# Patient Record
Sex: Male | Born: 1965 | Race: White | Hispanic: No | Smoking: Former smoker
Health system: Southern US, Community
[De-identification: ages and names within clinical notes are randomized; demographics above are authoritative.]

## PROBLEM LIST (undated history)

## (undated) DIAGNOSIS — F429 Obsessive-compulsive disorder, unspecified: Secondary | ICD-10-CM

## (undated) DIAGNOSIS — F419 Anxiety disorder, unspecified: Secondary | ICD-10-CM

## (undated) DIAGNOSIS — E785 Hyperlipidemia, unspecified: Secondary | ICD-10-CM

## (undated) DIAGNOSIS — E079 Disorder of thyroid, unspecified: Secondary | ICD-10-CM

## (undated) HISTORY — PX: HERNIA REPAIR: SHX51

## (undated) HISTORY — DX: Obsessive-compulsive disorder, unspecified: F42.9

## (undated) HISTORY — DX: Disorder of thyroid, unspecified: E07.9

## (undated) HISTORY — DX: Hyperlipidemia, unspecified: E78.5

## (undated) HISTORY — DX: Anxiety disorder, unspecified: F41.9

---

## 1997-10-19 ENCOUNTER — Emergency Department (HOSPITAL_COMMUNITY): Admission: EM | Admit: 1997-10-19 | Discharge: 1997-10-19 | Payer: Self-pay | Admitting: Emergency Medicine

## 1997-11-20 ENCOUNTER — Ambulatory Visit (HOSPITAL_COMMUNITY): Admission: RE | Admit: 1997-11-20 | Discharge: 1997-11-20 | Payer: Self-pay | Admitting: Surgery

## 1999-01-23 ENCOUNTER — Emergency Department (HOSPITAL_COMMUNITY): Admission: EM | Admit: 1999-01-23 | Discharge: 1999-01-23 | Payer: Self-pay | Admitting: Emergency Medicine

## 1999-07-12 ENCOUNTER — Encounter: Payer: Self-pay | Admitting: Emergency Medicine

## 1999-07-12 ENCOUNTER — Emergency Department (HOSPITAL_COMMUNITY): Admission: EM | Admit: 1999-07-12 | Discharge: 1999-07-12 | Payer: Self-pay | Admitting: Emergency Medicine

## 2005-08-11 ENCOUNTER — Ambulatory Visit (HOSPITAL_BASED_OUTPATIENT_CLINIC_OR_DEPARTMENT_OTHER): Admission: RE | Admit: 2005-08-11 | Discharge: 2005-08-11 | Payer: Self-pay | Admitting: Otolaryngology

## 2005-11-15 ENCOUNTER — Emergency Department (HOSPITAL_COMMUNITY): Admission: EM | Admit: 2005-11-15 | Discharge: 2005-11-16 | Payer: Self-pay | Admitting: Emergency Medicine

## 2006-04-09 ENCOUNTER — Encounter: Admission: RE | Admit: 2006-04-09 | Discharge: 2006-04-09 | Payer: Self-pay | Admitting: Family Medicine

## 2006-04-16 ENCOUNTER — Encounter: Admission: RE | Admit: 2006-04-16 | Discharge: 2006-04-16 | Payer: Self-pay | Admitting: Family Medicine

## 2006-05-01 ENCOUNTER — Ambulatory Visit (HOSPITAL_COMMUNITY): Admission: RE | Admit: 2006-05-01 | Discharge: 2006-05-02 | Payer: Self-pay | Admitting: Neurosurgery

## 2007-03-30 ENCOUNTER — Ambulatory Visit: Payer: Self-pay | Admitting: "Endocrinology

## 2007-05-04 ENCOUNTER — Encounter: Admission: RE | Admit: 2007-05-04 | Discharge: 2007-05-04 | Payer: Self-pay | Admitting: Family Medicine

## 2007-07-14 ENCOUNTER — Ambulatory Visit: Payer: Self-pay | Admitting: "Endocrinology

## 2007-11-10 ENCOUNTER — Ambulatory Visit: Payer: Self-pay | Admitting: "Endocrinology

## 2008-03-15 ENCOUNTER — Ambulatory Visit: Payer: Self-pay | Admitting: "Endocrinology

## 2008-10-16 ENCOUNTER — Ambulatory Visit: Payer: Self-pay | Admitting: "Endocrinology

## 2010-05-19 ENCOUNTER — Encounter: Payer: Self-pay | Admitting: Internal Medicine

## 2010-09-13 NOTE — Op Note (Signed)
NAME:  Ryan Cuevas, Ryan Cuevas              ACCOUNT NO.:  [id number]   MEDICAL RECORD NO.:  [id number]          PATIENT TYPE:  OIB   LOCATION:  3004                         FACILITY:  [facility]   PHYSICIAN:  Reinaldo Meeker, M.D. DATE OF BIRTH:  08/25/1965   DATE OF PROCEDURE:  05/01/2006  DATE OF DISCHARGE:                               OPERATIVE REPORT   PREOPERATIVE DIAGNOSIS:  Herniated disk, C5-6.   POSTOPERATIVE DIAGNOSIS:  Herniated disk, C5-6.   PROCEDURE:  C5-6 anterior cervical diskectomy with bone bank fusion  followed by Mystique anterior cervical plating.   SURGEON:  Dr. Gerlene Fee.   ASSISTANT:  Dr. Donalee Citrin.   PROCEDURE IN DETAIL:  After being placed in the supine position in 10  pounds halter traction, the patient's neck was prepped and draped in  usual sterile fashion.  Localizing fluoroscopy was used prior to  incision to identify the appropriate level.  Transverse incision was  made in the right anterior neck, started at the midline, headed towards  the medial aspect of the sternocleidomastoid muscle.  The platysma  muscle was then incised transversely.  The natural fascial plane between  the strap muscles medially and sternocleidomastoid laterally was  identified and followed down to the anterior aspect of the cervical  spine.  Longus colli muscles were identified, split in the midline,  stripped away bilaterally with a __________  resector and Key elevator.  Self-retaining retractor was placed for exposure, and x-rays showed  approach to the appropriate level.  Using a 15 blade, the disk at C5-6  was incised.  Using pituitary rongeurs and curettes, approximately 90%  of disk material was removed.  High-speed drill was used to widen the  interspace.  Microscope was draped, brought in the field and used for  the remainder of the case.  Using microsection technique, the remainder  of the disk material down to the post longitudinal ligament was removed.  Wound was then  incised transversely, and the __________  removed with a  Kerrison punch.  A very large amount of hernia disk material was  identified all the way across the spinal dura and into the foramen  bilaterally.  Thorough decompression was carried out, particular on the  left symptomatic side where very large amounts of hernia disk material  were found within the foramen.  When these were all removed, however,  the C6 nerve root was well  visualized and decompressed.  Similar  decompression had already been completed on the opposite side.  At this  time, inspection was carried out in all directions for any evidence of  residual compression, and none could be identified.  Large amounts of  irrigation were carried out.  Any bleeding was controlled bipolar  coagulation and Gelfoam.  Measurements were taken, and a 7-mm bone bank  plug was reconstituted.  After irrigating once more and confirming  hemostasis, plug was impacted without difficulty, and fluoroscopy it to  be in good position.  The appropriate length Mystique anterior cervical  plate was then chosen.  Under fluoroscopic guidance, drill holes were  placed followed by tapping and placing 13-mm  screws x4.  Final  fluoroscopy showed the plate screws and plug to all be in good position.  Large amounts of irrigation were carried out, any bleeding controlled  bipolar coagulation.  The wound was then closed using interrupted Vicryl on the platysmal  muscle and inverted 5-0 PDS in the subcu layer and Steri-Strips on the  skin.  Sterile dressing and soft collar applied.  The patient was  extubated and taken to the recovery room in stable condition.           ______________________________  Reinaldo Meeker, M.D.     ROK/MEDQ  D:  05/01/2006  T:  05/01/2006  Job:  [id number]

## 2010-09-13 NOTE — Op Note (Signed)
NAME:  Ryan Cuevas, Ryan Cuevas              ACCOUNT NO.:  [id number]   MEDICAL RECORD NO.:  [id number]          PATIENT TYPE:  AMB   LOCATION:  DSC                          FACILITY:  [facility]   PHYSICIAN:  Jefry H. Pollyann Kennedy, MD     DATE OF BIRTH:  1965-10-02   DATE OF PROCEDURE:  08/11/2005  DATE OF DISCHARGE:                                 OPERATIVE REPORT   PREOPERATIVE DIAGNOSES:  1.  Nasal septal deviation.  2.  Bilateral inferior turbinate hypertrophy with obstruction.   POSTOPERATIVE DIAGNOSES:  1.  Nasal septal deviation.  2.  Bilateral inferior turbinate hypertrophy with obstruction.   PROCEDURES:  1.  Nasal septoplasty.  2.  Submucous resection, inferior turbinates bilaterally.   SURGEON:  Jefry H. Pollyann Kennedy, M.D.   ANESTHESIA:  General endotracheal anesthesia was used.   COMPLICATIONS:  No complications.   BLOOD LOSS:  Minimal.   REFERRING PHYSICIAN:  Dr. Woodfin Ganja.   FINDINGS:  Severe rightward septal deviation caused by a fracture of the  posterior quadrangular cartilage and anterior ethmoid plate with deflection  of the vomer and the maxillary crest also to the right side.  There was  compensatory enlargement of the turbinates.  Within the junction of the bony  and cartilaginous septum, there was a 1-cm area of soft tissue, which I  believe was from loss of cartilage from the past trauma.   HISTORY:  A 45 year old with a long history of nasal obstruction, history of  nasal trauma in the past.  Risks, benefits, alternatives, and complications  of the procedure were explained to the patient, who seemed to understand and  agreed to surgery.   PROCEDURE:  The patient was taken to the operating room and placed on the  operating table in the supine position.  Following induction of general  endotracheal anesthesia, the patient was prepped and draped in a standard  fashion.  Oxymetazoline spray was used preoperatively in the nasal cavities.  Xylocaine 1% with epinephrine was  infiltrated into the septum, the  columella, and the inferior turbinates.  Afrin-soaked pledgets were placed  in the nasal cavities bilaterally.   1.  Nasal septoplasty.  A left hemitransfixion incision was used to approach      the septal cartilage, and a mucosal flap was developed posteriorly to      the sphenoid rostrum on the left.  The bony cartilaginous junction, or      what remained of it, was divided, and a similar mucosal flap was      developed down the right side posteriorly.  The entire ethmoid plate was      resected with Jansen-Middleton rongeurs.  The vomerian bone was also      removed.  This treated the posterior obstruction.  The mucosal flap was      then developed anteriorly around the caudal edge of the quadrangular      cartilage.  Maxillary crest bone that was deflected to the right was      shaved off with a small osteotome.  The remainder of the cartilage was  left intact.  The mucosal incision was reapproximated with chromic      suture.  The septal flaps were quilted with plain gut.  Ninety percent      of the obstruction was relieved after this procedure.  2.  Submucous resection, inferior turbinates.  The leading edge of the      inferior turbinates was incised vertically with a 15 scalpel.  Mucosa      was dissected off of the bone, and bony fragments were resected using      Takahashi forceps.  The turbinate remnants were then outfractured.  The      nasal cavities were suctioned of blood and secretions as was the      pharynx.  The nasal cavities were then packed with rolled-up Telfa      coated with bacitracin ointment.  The patient was then awakened,      extubated, and transferred to recovery in good condition.      Jefry H. Pollyann Kennedy, MD  Electronically Signed     JHR/MEDQ  D:  08/11/2005  T:  08/11/2005  Job:  [id number]   cc:   Jethro Bastos, M.D.  Fax: [phone]

## 2013-06-12 ENCOUNTER — Encounter: Payer: Self-pay | Admitting: *Deleted

## 2013-06-12 DIAGNOSIS — R079 Chest pain, unspecified: Secondary | ICD-10-CM | POA: Insufficient documentation

## 2016-12-20 ENCOUNTER — Emergency Department (HOSPITAL_COMMUNITY): Payer: Worker's Compensation

## 2016-12-20 ENCOUNTER — Encounter (HOSPITAL_COMMUNITY): Payer: Self-pay | Admitting: Nurse Practitioner

## 2016-12-20 ENCOUNTER — Emergency Department (HOSPITAL_COMMUNITY)
Admission: EM | Admit: 2016-12-20 | Discharge: 2016-12-20 | Disposition: A | Discharge status: Home / Self Care | Payer: Worker's Compensation | Attending: Emergency Medicine | Admitting: Emergency Medicine

## 2016-12-20 DIAGNOSIS — Z79899 Other long term (current) drug therapy: Secondary | ICD-10-CM | POA: Insufficient documentation

## 2016-12-20 DIAGNOSIS — S61219A Laceration without foreign body of unspecified finger without damage to nail, initial encounter: Secondary | ICD-10-CM | POA: Insufficient documentation

## 2016-12-20 DIAGNOSIS — R55 Syncope and collapse: Secondary | ICD-10-CM | POA: Insufficient documentation

## 2016-12-20 DIAGNOSIS — Z23 Encounter for immunization: Secondary | ICD-10-CM | POA: Insufficient documentation

## 2016-12-20 DIAGNOSIS — Y929 Unspecified place or not applicable: Secondary | ICD-10-CM | POA: Diagnosis not present

## 2016-12-20 DIAGNOSIS — S6992XA Unspecified injury of left wrist, hand and finger(s), initial encounter: Secondary | ICD-10-CM | POA: Diagnosis present

## 2016-12-20 DIAGNOSIS — Y990 Civilian activity done for income or pay: Secondary | ICD-10-CM | POA: Insufficient documentation

## 2016-12-20 DIAGNOSIS — W19XXXA Unspecified fall, initial encounter: Secondary | ICD-10-CM | POA: Insufficient documentation

## 2016-12-20 DIAGNOSIS — Y9302 Activity, running: Secondary | ICD-10-CM | POA: Insufficient documentation

## 2016-12-20 MED ORDER — TETANUS-DIPHTH-ACELL PERTUSSIS 5-2.5-18.5 LF-MCG/0.5 IM SUSP
0.5000 mL | Freq: Once | INTRAMUSCULAR | Status: AC
  Administered 2016-12-20: 0.5 mL via INTRAMUSCULAR
  Filled 2016-12-20: qty 0.5

## 2016-12-20 MED ORDER — BACITRACIN ZINC 500 UNIT/GM EX OINT
1.0000 "application " | TOPICAL_OINTMENT | Freq: Two times a day (BID) | CUTANEOUS | Status: DC
  Administered 2016-12-20: 1 via TOPICAL
  Filled 2016-12-20: qty 2.7

## 2016-12-20 MED ORDER — LIDOCAINE HCL (PF) 1 % IJ SOLN
5.0000 mL | Freq: Once | INTRAMUSCULAR | Status: AC
  Administered 2016-12-20: 5 mL
  Filled 2016-12-20: qty 30

## 2016-12-20 MED ORDER — LIDOCAINE HCL (PF) 1 % IJ SOLN
INTRAMUSCULAR | Status: AC
  Filled 2016-12-20: qty 30

## 2016-12-20 MED ORDER — CEPHALEXIN 500 MG PO CAPS: 500.0000 mg | ORAL_CAPSULE | Freq: Two times a day (BID) | ORAL | 0 refills | Status: DC
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
## 2016-12-20 NOTE — ED Notes (Signed)
Pt stated to registration that this may be a workman's comp case. When discussing the situation with the patient, patient stated that he did not have any paperwork and his boss did not give him any paper work. Patient states that he will fill out the paperwork with his boss on Monday and proceed from there.

## 2016-12-24 ENCOUNTER — Encounter (HOSPITAL_COMMUNITY): Payer: Self-pay | Admitting: Emergency Medicine

## 2016-12-24 ENCOUNTER — Emergency Department (HOSPITAL_COMMUNITY)
Admission: EM | Admit: 2016-12-24 | Discharge: 2016-12-25 | Disposition: A | Discharge status: Home / Self Care | Payer: Worker's Compensation | Attending: Emergency Medicine | Admitting: Emergency Medicine

## 2016-12-24 DIAGNOSIS — S61215D Laceration without foreign body of left ring finger without damage to nail, subsequent encounter: Secondary | ICD-10-CM | POA: Diagnosis not present

## 2016-12-24 DIAGNOSIS — Z5189 Encounter for other specified aftercare: Secondary | ICD-10-CM

## 2016-12-24 DIAGNOSIS — Z79899 Other long term (current) drug therapy: Secondary | ICD-10-CM | POA: Diagnosis not present

## 2016-12-24 DIAGNOSIS — Z87891 Personal history of nicotine dependence: Secondary | ICD-10-CM | POA: Insufficient documentation

## 2016-12-24 DIAGNOSIS — L089 Local infection of the skin and subcutaneous tissue, unspecified: Secondary | ICD-10-CM | POA: Insufficient documentation

## 2016-12-24 DIAGNOSIS — W1830XA Fall on same level, unspecified, initial encounter: Secondary | ICD-10-CM | POA: Insufficient documentation

## 2016-12-24 LAB — COMPREHENSIVE METABOLIC PANEL
ALK PHOS: 79 U/L (ref 38–126)
ALT: 40 U/L (ref 17–63)
ANION GAP: 9 (ref 5–15)
AST: 33 U/L (ref 15–41)
Albumin: 4.1 g/dL (ref 3.5–5.0)
BUN: 19 mg/dL (ref 6–20)
CALCIUM: 9 mg/dL (ref 8.9–10.3)
CO2: 24 mmol/L (ref 22–32)
CREATININE: 1.39 mg/dL — AB (ref 0.61–1.24)
Chloride: 103 mmol/L (ref 101–111)
GFR, EST NON AFRICAN AMERICAN: 58 mL/min — AB (ref >60)
Glucose, Bld: 107 mg/dL — ABNORMAL HIGH (ref 65–99)
Potassium: 4.3 mmol/L (ref 3.5–5.1)
Sodium: 136 mmol/L (ref 135–145)
Total Bilirubin: 1.2 mg/dL (ref 0.3–1.2)
Total Protein: 7.2 g/dL (ref 6.5–8.1)

## 2016-12-24 LAB — CBC WITH DIFFERENTIAL/PLATELET
BASOS ABS: 0 10*3/uL (ref 0.0–0.1)
BASOS PCT: 0 %
EOS ABS: 0.1 10*3/uL (ref 0.0–0.7)
Eosinophils Relative: 2 %
HCT: 44.2 % (ref 39.0–52.0)
HEMOGLOBIN: 15.4 g/dL (ref 13.0–17.0)
Lymphocytes Relative: 32 %
Lymphs Abs: 2.5 10*3/uL (ref 0.7–4.0)
MCH: 30.5 pg (ref 26.0–34.0)
MCHC: 34.8 g/dL (ref 30.0–36.0)
MCV: 87.5 fL (ref 78.0–100.0)
Monocytes Absolute: 0.5 10*3/uL (ref 0.1–1.0)
Monocytes Relative: 6 %
NEUTROS ABS: 4.7 10*3/uL (ref 1.7–7.7)
NEUTROS PCT: 60 %
Platelets: 229 10*3/uL (ref 150–400)
RBC: 5.05 MIL/uL (ref 4.22–5.81)
RDW: 12.9 % (ref 11.5–15.5)
WBC: 7.9 10*3/uL (ref 4.0–10.5)

## 2016-12-24 LAB — I-STAT CG4 LACTIC ACID, ED: LACTIC ACID, VENOUS: 1.09 mmol/L (ref 0.5–1.9)

## 2016-12-24 MED ORDER — DOXYCYCLINE HYCLATE 100 MG PO TABS
100.0000 mg | ORAL_TABLET | Freq: Once | ORAL | Status: AC
  Administered 2016-12-25: 100 mg via ORAL
  Filled 2016-12-24: qty 1

## 2016-12-24 MED ORDER — LIDOCAINE HCL (PF) 1 % IJ SOLN
30.0000 mL | Freq: Once | INTRAMUSCULAR | Status: AC
  Administered 2016-12-25: 30 mL
  Filled 2016-12-24: qty 30

## 2016-12-24 MED ORDER — DOXYCYCLINE HYCLATE 100 MG PO CAPS: 100.0000 mg | ORAL_CAPSULE | Freq: Two times a day (BID) | ORAL | 0 refills | Status: AC

## 2016-12-24 NOTE — ED Triage Notes (Signed)
Pt had sutures placed Saturday to right hand; sites swelling and red at present time; concern for infection.

## 2016-12-24 NOTE — ED Provider Notes (Signed)
[facility]ER[facility]Y DEPT Provider Note   CSN: [id number] Arrival date & time: 12/24/16  1844     History   Chief Complaint Chief Complaint  Patient presents with  . Hand Pain  . Possible Cellulitis    HPI Ryan Cuevas is a 51 y.o. male.  Patient presents to the ED with a chief complaint of finger swelling.  Patient was seen last week after falling and sustaining an injury to his fingers.  He had several abrasions, and required to suturing in 2 locations.  He states that over the past day, he has had swelling to his left ring finger at the PIP.  He reports increased pain with movement of the finger.  He has been taking antibiotics as prescribed.  There are no other associated symptoms.  Specifically, he denies any fever, chills, numbness, weakness, or tingling.  He reports that he has been soaking the wound this week.   The history is provided by the patient. No language interpreter was used.    Past Medical History:  Diagnosis Date  . Anxiety   . Hyperlipidemia   . OCD (obsessive compulsive disorder)   . Thyroid disease     Patient Active Problem List   Diagnosis Date Noted  . Chest pain 06/12/2013    Past Surgical History:  Procedure Laterality Date  . HERNIA REPAIR         Home Medications    Prior to Admission medications   Medication Sig Start Date End Date Taking? Authorizing Provider  ALPRAZolam (XANAX XR) 0.5 MG 24 hr tablet Take 0.5 mg by mouth daily.  12/09/16   [provider]  cephALEXin (KEFLEX) 500 MG capsule Take 1 capsule (500 mg total) by mouth 2 (two) times daily. 12/20/16   Roxy Horseman, PA-C  doxycycline (VIBRAMYCIN) 100 MG capsule Take 100 mg by mouth daily.  12/08/16   [provider]  FLUoxetine (PROZAC) 40 MG capsule Take 40 mg by mouth daily.     [provider]  lamoTRIgine (LAMICTAL) 100 MG tablet Take 200 mg by mouth daily.  12/09/16   [provider]  propranolol (INDERAL) 10 MG tablet Take 1/2 to 1  tablet 30 to 60 minutes before needed for tremor control. 01/03/16   [provider]  zolpidem (AMBIEN) 10 MG tablet Take 10 mg by mouth at bedtime as needed for sleep.    [provider]    Family History Family History  Problem Relation Age of Onset  . CVA Mother   . Coronary artery disease Father   . CVA Father   . Hypertension Sister     Social History Social History  Substance Use Topics  . Smoking status: Former Smoker    Quit date: 06/12/2002  . Smokeless tobacco: Not on file  . Alcohol use No     Allergies   Patient has no known allergies.   Review of Systems Review of Systems  All other systems reviewed and are negative.    Physical Exam Updated Vital Signs BP (!) 112/99 (BP Location: Left Arm)   Pulse 91   Temp 98.3 F (36.8 C) (Oral)   Resp 18   Ht 5\' 10"  (1.778 m)   Wt 90.7 kg (200 lb)   SpO2 98%   BMI 28.70 kg/m   Physical Exam  Constitutional: He is oriented to person, place, and time. He appears well-developed and well-nourished.  HENT:  Head: Normocephalic and atraumatic.  Eyes: Conjunctivae and EOM are normal.  Neck:  Normal range of motion.  Cardiovascular: Normal rate.   Pulmonary/Chest: Effort normal.  Abdominal: He exhibits no distension.  Musculoskeletal: Normal range of motion.  Left ring finger with mild inflammation at erythema at the PIP ROM/strength reduced 2/2 pain  Neurological: He is alert and oriented to person, place, and time.  Skin: Skin is dry.  Sutures intact  Psychiatric: He has a normal mood and affect. His behavior is normal. Judgment and thought content normal.  Nursing note and vitals reviewed.    ED Treatments / Results  Labs (all labs ordered are listed, but only abnormal results are displayed) Labs Reviewed  COMPREHENSIVE METABOLIC PANEL - Abnormal; Notable for the following:       Result Value   Glucose, Bld 107 (*)    Creatinine, Ser 1.39 (*)    GFR calc non Af Amer 58 (*)    All  other components within normal limits  CBC WITH DIFFERENTIAL/PLATELET  I-STAT CG4 LACTIC ACID, ED  I-STAT CG4 LACTIC ACID, ED    EKG  EKG Interpretation None       Radiology No results found.  Procedures Procedures (including critical care time) INCISION AND DRAINAGE Performed by: Roxy Horseman Consent: Verbal consent obtained. Risks and benefits: risks, benefits and alternatives were discussed Type: abscess  Body area: Left dorsal ring finger at the PIP  Anesthesia: local infiltration  Incision was made with a scalpel. Nerve Block: digital block Local anesthetic: lidocaine 1% with epinephrine  Anesthetic total: 5 ml  Complexity: complex Blunt dissection to break up loculations Copiously irrigated with syringe  Drainage: scant purulence   Drainage amount:   Packing material: not packed  Patient tolerance: Patient tolerated the procedure well with no immediate complications.  Finger extension intact after procedure isolated at PIP and DIP.   Medications Ordered in ED Medications  lidocaine (PF) (XYLOCAINE) 1 % injection 30 mL (not administered)     Initial Impression / Assessment and Plan / ED Course  I have reviewed the triage vital signs and the nursing notes.  Pertinent labs & imaging results that were available during my care of the patient were reviewed by me and considered in my medical decision making (see chart for details).     Patient here for wound check.  Small cellulitis vs start of abscess formation at the left ring PIP dorsally.  I opened the wound, copiously irrigated and dressed the wound.  I will step his antibiotics up to doxy.    Return precautions given.  Final Clinical Impressions(s) / ED Diagnoses   Final diagnoses:  Finger infection  Visit for wound check    New Prescriptions New Prescriptions   DOXYCYCLINE (VIBRAMYCIN) 100 MG CAPSULE    Take 1 capsule (100 mg total) by mouth 2 (two) times daily.     Roxy Horseman, PA-C 12/25/16 0006    Gilda Crease, MD 12/25/16 [phone]

## 2016-12-24 NOTE — ED Notes (Signed)
ED PA at bedside

## 2016-12-25 NOTE — ED Notes (Signed)
Pt ambulatory and independent at discharge.  Verbalized understanding of discharge instructions 

## 2018-07-31 IMAGING — CR DG HAND COMPLETE 3+V*L*
3 series · 3 of 3 positions shown · non-contrast
Comparison: None.

CLINICAL DATA: Left hand pain after injury.

EXAM:
LEFT HAND - COMPLETE 3+ VIEW

[x hand pa left]
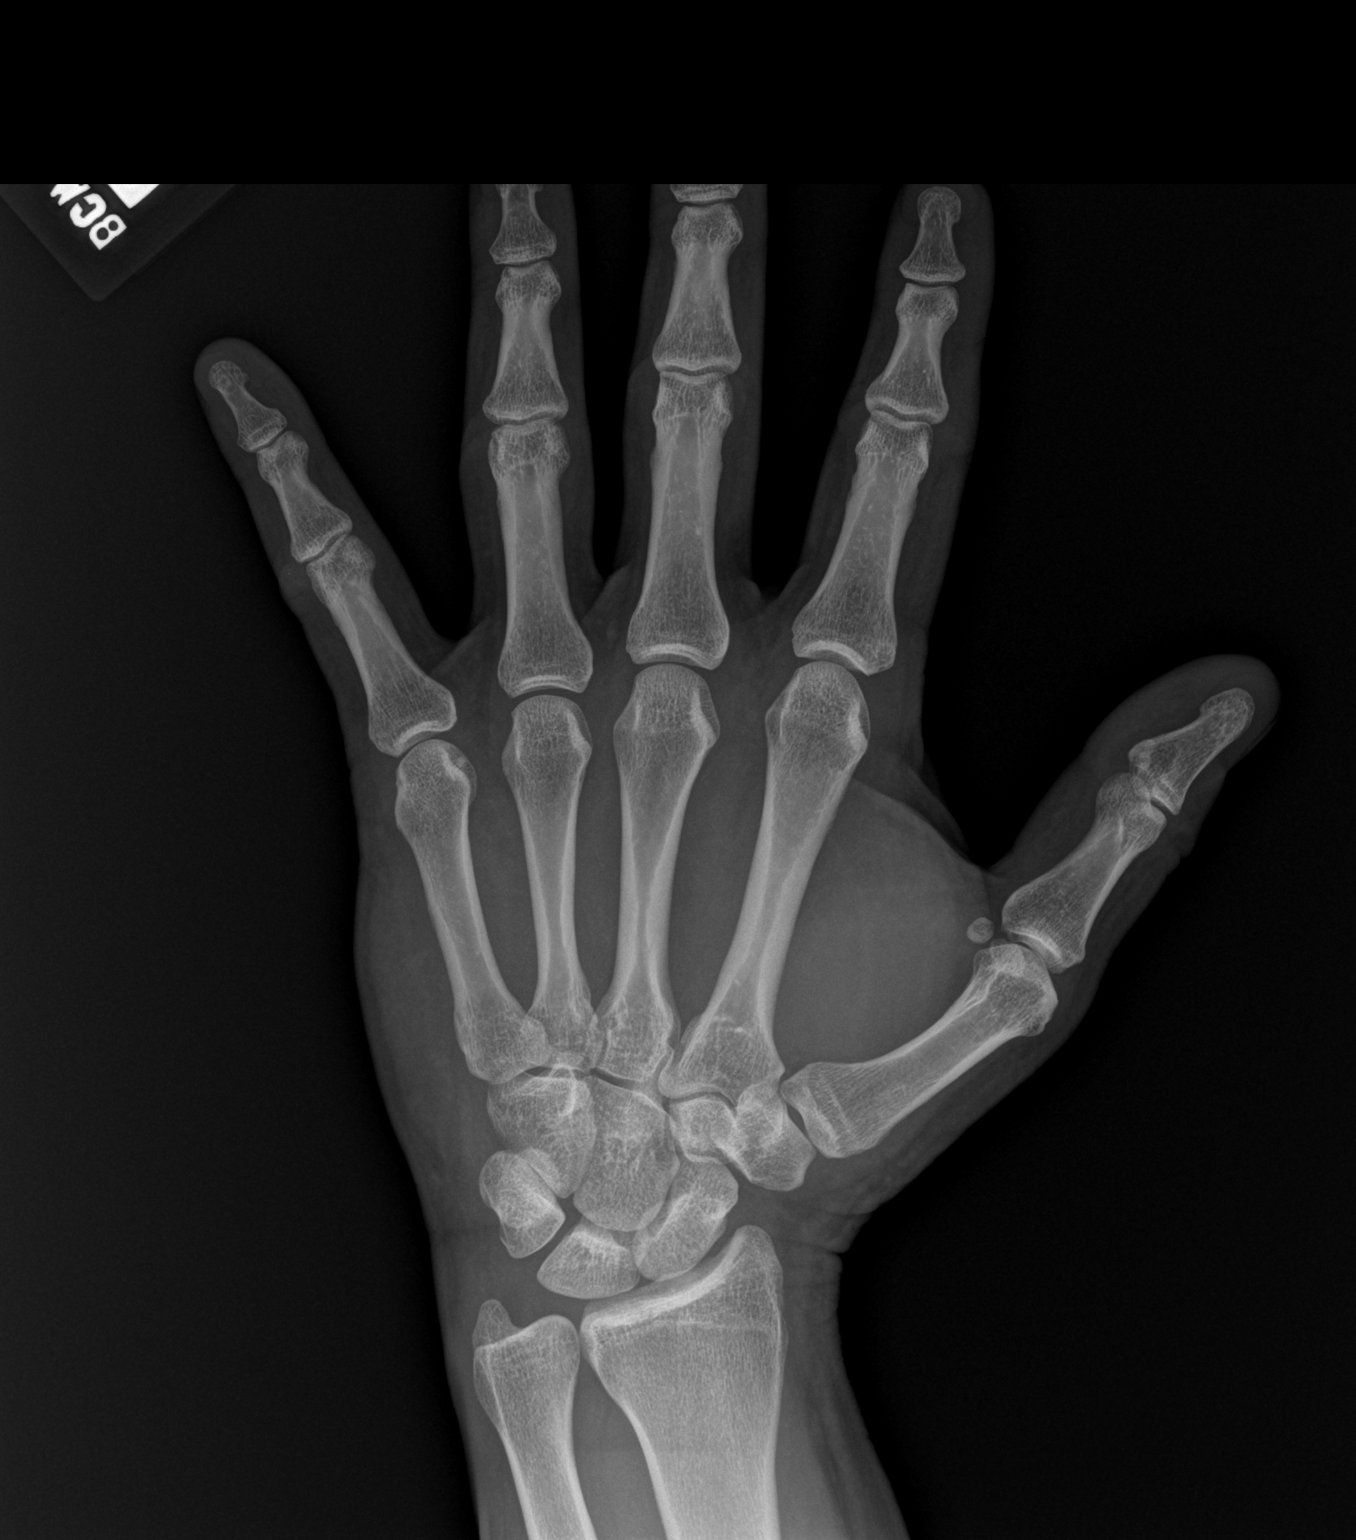

[x hand obl left]
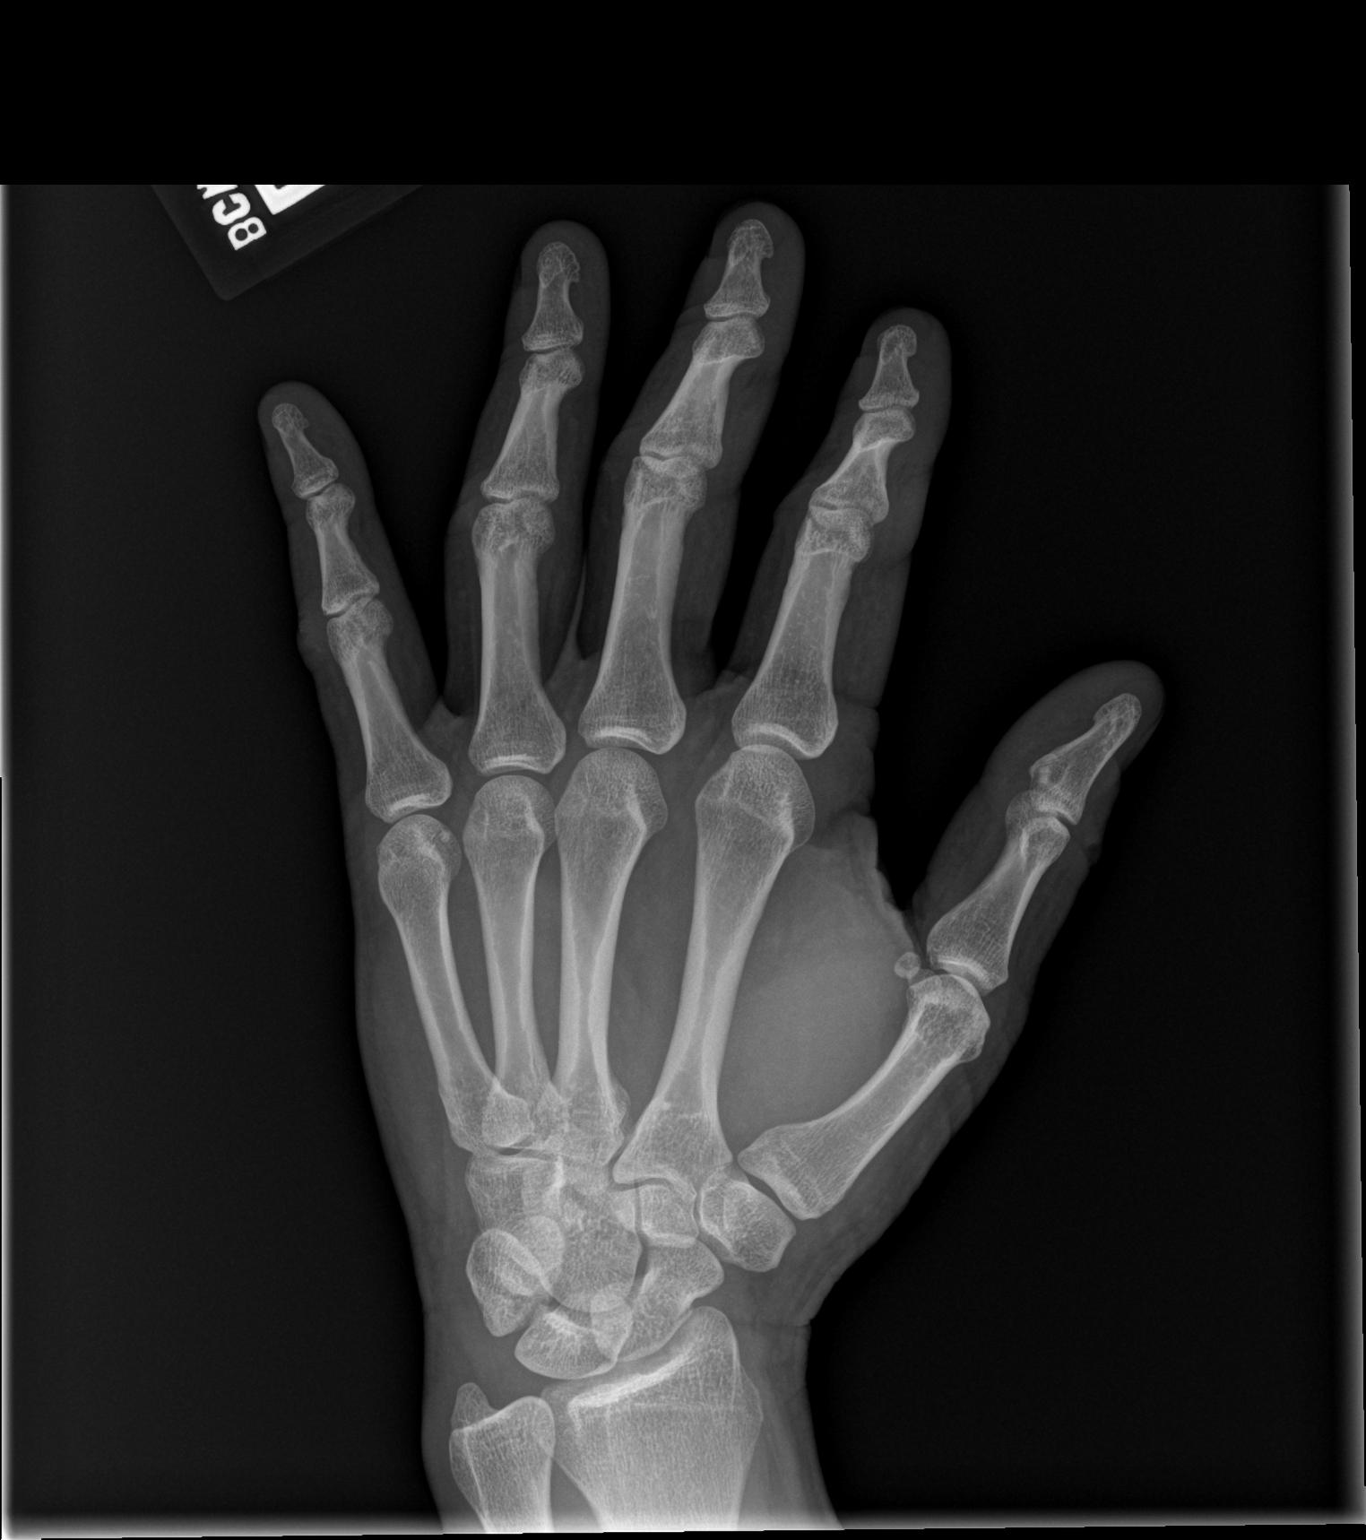

[x hand lat left]
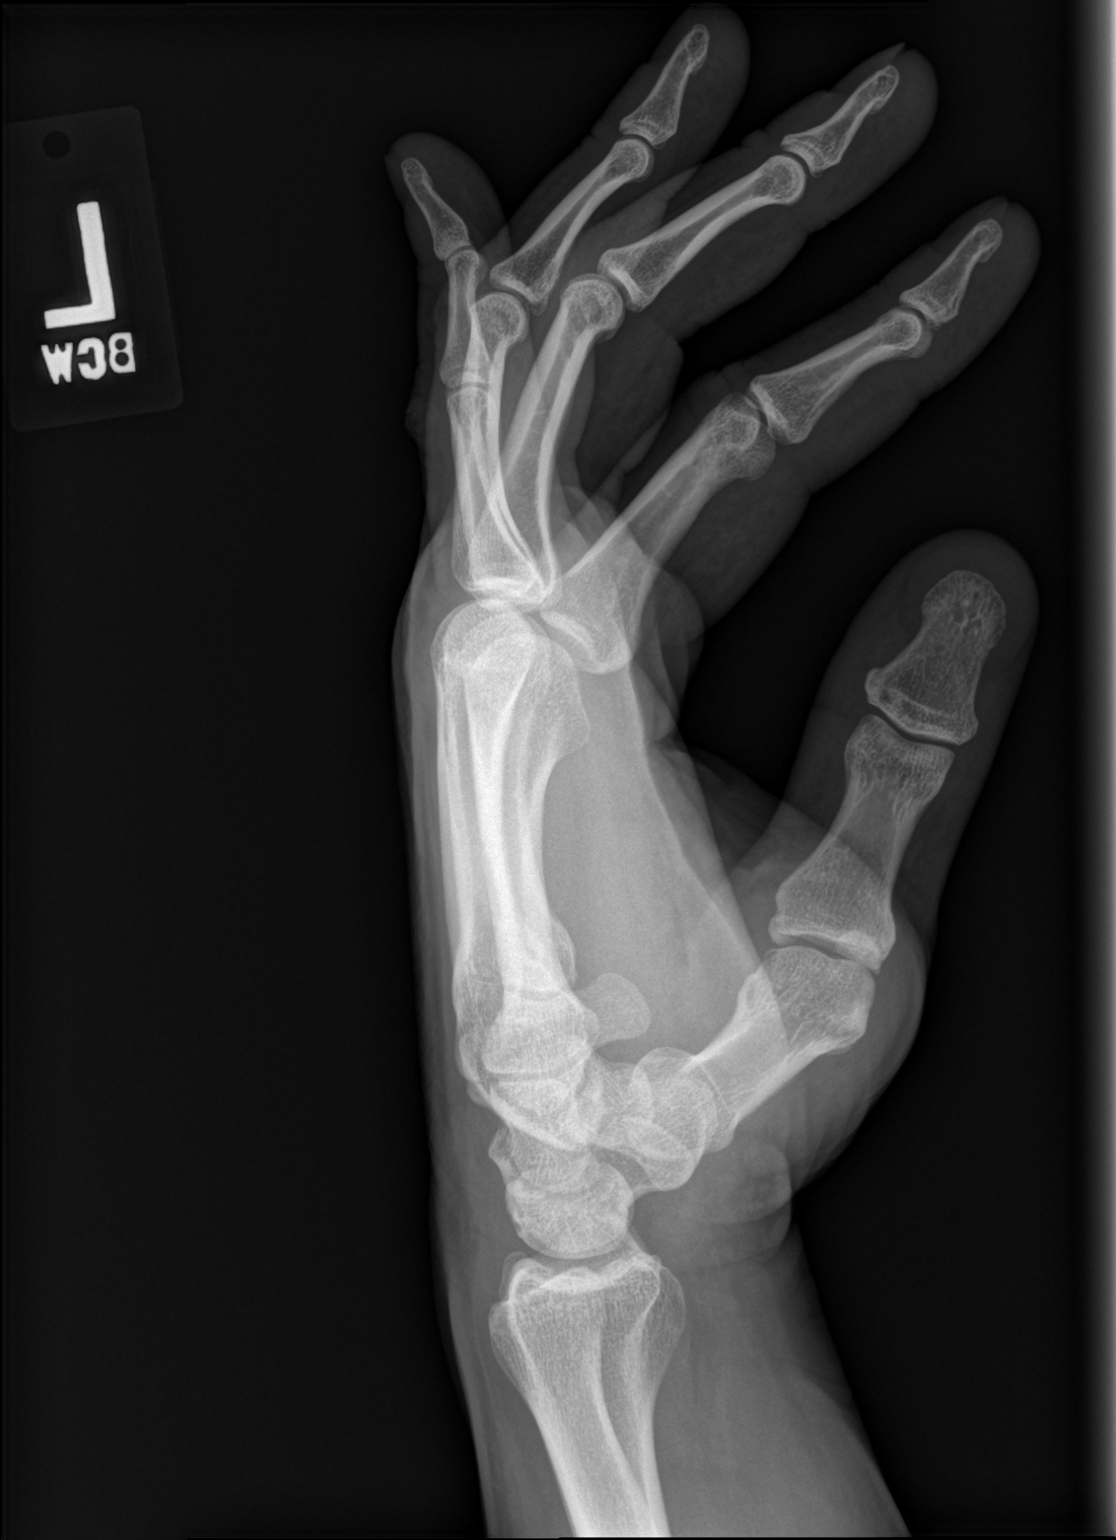

[3 of 3 positions shown; findings below may reference images not displayed]

FINDINGS: There is no evidence of fracture or dislocation. There is no
evidence of arthropathy or other focal bone abnormality. Soft
tissues are unremarkable.
IMPRESSION: Negative radiographs of the left hand.

## 2018-10-21 ENCOUNTER — Ambulatory Visit
Admission: RE | Admit: 2018-10-21 | Discharge: 2018-10-21 | Disposition: A | Discharge status: Home / Self Care | Payer: BC Managed Care – PPO | Source: Ambulatory Visit | Attending: Chiropractic Medicine | Admitting: Chiropractic Medicine

## 2018-10-21 ENCOUNTER — Other Ambulatory Visit: Payer: Self-pay | Admitting: Chiropractic Medicine

## 2018-10-21 DIAGNOSIS — M542 Cervicalgia: Secondary | ICD-10-CM

## 2018-10-21 DIAGNOSIS — M25511 Pain in right shoulder: Secondary | ICD-10-CM

## 2018-10-21 DIAGNOSIS — M5489 Other dorsalgia: Secondary | ICD-10-CM

## 2018-12-06 ENCOUNTER — Other Ambulatory Visit: Payer: Self-pay

## 2018-12-06 ENCOUNTER — Encounter: Payer: Self-pay | Admitting: Physician Assistant

## 2018-12-06 ENCOUNTER — Telehealth: Payer: BC Managed Care – PPO | Admitting: Physician Assistant

## 2018-12-06 DIAGNOSIS — R6889 Other general symptoms and signs: Secondary | ICD-10-CM | POA: Diagnosis not present

## 2018-12-06 DIAGNOSIS — R197 Diarrhea, unspecified: Secondary | ICD-10-CM

## 2018-12-06 DIAGNOSIS — G4489 Other headache syndrome: Secondary | ICD-10-CM

## 2018-12-06 DIAGNOSIS — Z20822 Contact with and (suspected) exposure to covid-19: Secondary | ICD-10-CM

## 2018-12-06 NOTE — Progress Notes (Signed)
E-Visit for Corona Virus Screening   Your current symptoms could be consistent with the coronavirus.  Many health care providers can now test patients at their office but not all are.  [facility] has multiple testing sites. For information on our COVID testing locations and hours go to [email or web address]  Please quarantine yourself while awaiting your test results.  We are enrolling you in our MyChart Home [facility] for COVID19 . Daily you will receive a questionnaire within the MyChart website. Our COVID 19 response team willl be monitoriing your responses daily.   COVID 19 Testing Locations (Monday - Friday, 8 a.m. - 3:30 p.m.) [place]: [facility], [address], , [place]: [facility], [address], [place], [place] (entrance off [address]) [place]: [address], [place], [place] (across from [facility])    COVID-19 is a respiratory illness with symptoms that are similar to the flu. Symptoms are typically mild to moderate, but there have been cases of severe illness and death due to the virus. The following symptoms may appear 2-14 days after exposure: . Fever . Cough . Shortness of breath or difficulty breathing . Chills . Repeated shaking with chills . Muscle pain . Headache . Sore throat . New loss of taste or smell . Fatigue . Congestion or runny nose . Nausea or vomiting . Diarrhea  It is vitally important that if you feel that you have an infection such as this virus or any other virus that you stay home and away from places where you may spread it to others.  You should self-quarantine for 14 days if you have symptoms that could potentially be coronavirus or have been in close contact a with a person diagnosed with COVID-19 within the last 2 weeks. You should avoid contact with people age 65 and older.   You should wear a mask  or cloth face covering over your nose and mouth if you must be around other people or animals, including pets (even at home). Try to stay at least 6 feet away from other people. This will protect the people around you.    You may also take acetaminophen (Tylenol) as needed for fever.  I have provided a work note    Reduce your risk of any infection by using the same precautions used for avoiding the common cold or flu:  . Wash your hands often with soap and warm water for at least 20 seconds.  If soap and water are not readily available, use an alcohol-based hand sanitizer with at least 60% alcohol.  . If coughing or sneezing, cover your mouth and nose by coughing or sneezing into the elbow areas of your shirt or coat, into a tissue or into your sleeve (not your hands). . Avoid shaking hands with others and consider head nods or verbal greetings only. . Avoid touching your eyes, nose, or mouth with unwashed hands.  . Avoid close contact with people who are sick. . Avoid places or events with large numbers of people in one location, like concerts or sporting events. . Carefully consider travel plans you have or are making. . If you are planning any travel outside or inside the [place], visit the CDC's Travelers' Health webpage for the latest health notices. . If you have some symptoms but not all symptoms, continue to monitor at home and seek medical attention if your symptoms worsen. . If you are having a medical emergency,   call 911.  HOME CARE . Only take medications as instructed by your medical team. . Drink plenty of fluids and get plenty of rest. . A steam or ultrasonic humidifier can help if you have congestion.   GET HELP RIGHT AWAY IF YOU HAVE EMERGENCY WARNING SIGNS** FOR COVID-19. If you or someone is showing any of these signs seek emergency medical care immediately. Call 911 or proceed to your closest emergency facility if: . You develop worsening high fever. . Trouble  breathing . Bluish lips or face . Persistent pain or pressure in the chest . New confusion . Inability to wake or stay awake . You cough up blood. . Your symptoms become more severe  **This list is not all possible symptoms. Contact your medical provider for any symptoms that are sever or concerning to you.   MAKE SURE YOU   Understand these instructions.  Will watch your condition.  Will get help right away if you are not doing well or get worse.  Your e-visit answers were reviewed by a board certified advanced clinical practitioner to complete your personal care plan.  Depending on the condition, your plan could have included both over the counter or prescription medications.  If there is a problem please reply once you have received a response from your provider.  Your safety is important to us.  If you have drug allergies check your prescription carefully.    You can use MyChart to ask questions about today's visit, request a non-urgent call back, or ask for a work or school excuse for 24 hours related to this e-Visit. If it has been greater than 24 hours you will need to follow up with your provider, or enter a new e-Visit to address those concerns. You will get an e-mail in the next two days asking about your experience.  I hope that your e-visit has been valuable and will speed your recovery. Thank you for using e-visits.   I spent 5-10 minutes on review and completion of this note- Zia Kanner PAC  

## 2018-12-07 ENCOUNTER — Encounter (INDEPENDENT_AMBULATORY_CARE_PROVIDER_SITE_OTHER): Payer: Self-pay

## 2018-12-07 LAB — NOVEL CORONAVIRUS, NAA: SARS-CoV-2, NAA: NOT DETECTED

## 2018-12-08 ENCOUNTER — Encounter (INDEPENDENT_AMBULATORY_CARE_PROVIDER_SITE_OTHER): Payer: Self-pay

## 2018-12-09 ENCOUNTER — Encounter (INDEPENDENT_AMBULATORY_CARE_PROVIDER_SITE_OTHER): Payer: Self-pay

## 2018-12-10 ENCOUNTER — Encounter (INDEPENDENT_AMBULATORY_CARE_PROVIDER_SITE_OTHER): Payer: Self-pay

## 2018-12-11 ENCOUNTER — Encounter (INDEPENDENT_AMBULATORY_CARE_PROVIDER_SITE_OTHER): Payer: Self-pay

## 2018-12-12 ENCOUNTER — Encounter (INDEPENDENT_AMBULATORY_CARE_PROVIDER_SITE_OTHER): Payer: Self-pay

## 2018-12-13 ENCOUNTER — Encounter (INDEPENDENT_AMBULATORY_CARE_PROVIDER_SITE_OTHER): Payer: Self-pay

## 2018-12-14 ENCOUNTER — Encounter (INDEPENDENT_AMBULATORY_CARE_PROVIDER_SITE_OTHER): Payer: Self-pay

## 2018-12-15 ENCOUNTER — Encounter (INDEPENDENT_AMBULATORY_CARE_PROVIDER_SITE_OTHER): Payer: Self-pay

## 2018-12-16 ENCOUNTER — Encounter (INDEPENDENT_AMBULATORY_CARE_PROVIDER_SITE_OTHER): Payer: Self-pay

## 2018-12-17 ENCOUNTER — Encounter (INDEPENDENT_AMBULATORY_CARE_PROVIDER_SITE_OTHER): Payer: Self-pay

## 2018-12-18 ENCOUNTER — Encounter (INDEPENDENT_AMBULATORY_CARE_PROVIDER_SITE_OTHER): Payer: Self-pay

## 2018-12-19 ENCOUNTER — Encounter (INDEPENDENT_AMBULATORY_CARE_PROVIDER_SITE_OTHER): Payer: Self-pay

## 2020-05-31 IMAGING — CR LUMBAR SPINE - COMPLETE 4+ VIEW
5 series · 5 of 5 positions shown · non-contrast
Comparison: CT abdomen pelvis dated May 04, 2007.

CLINICAL DATA: Chronic low back pain radiating into both legs.

EXAM:
LUMBAR SPINE - COMPLETE 4+ VIEW

[t lumbar spine ap]
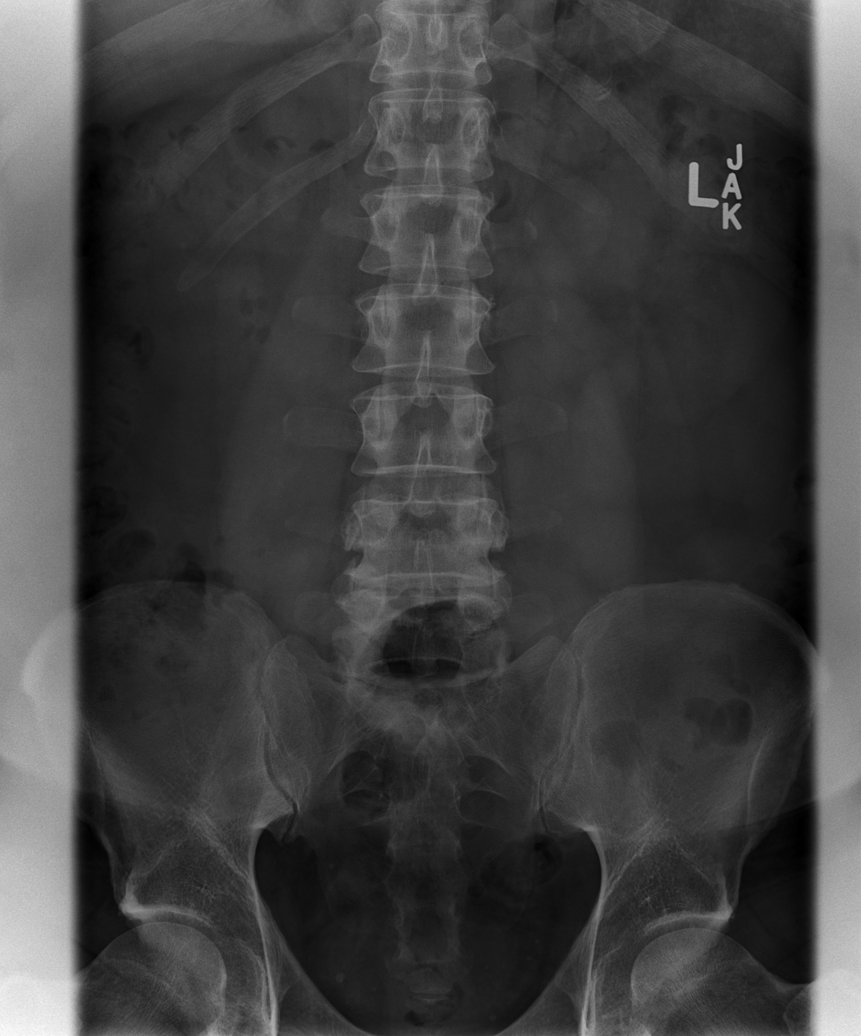

[t lumbar spine obl (1 of 2)]
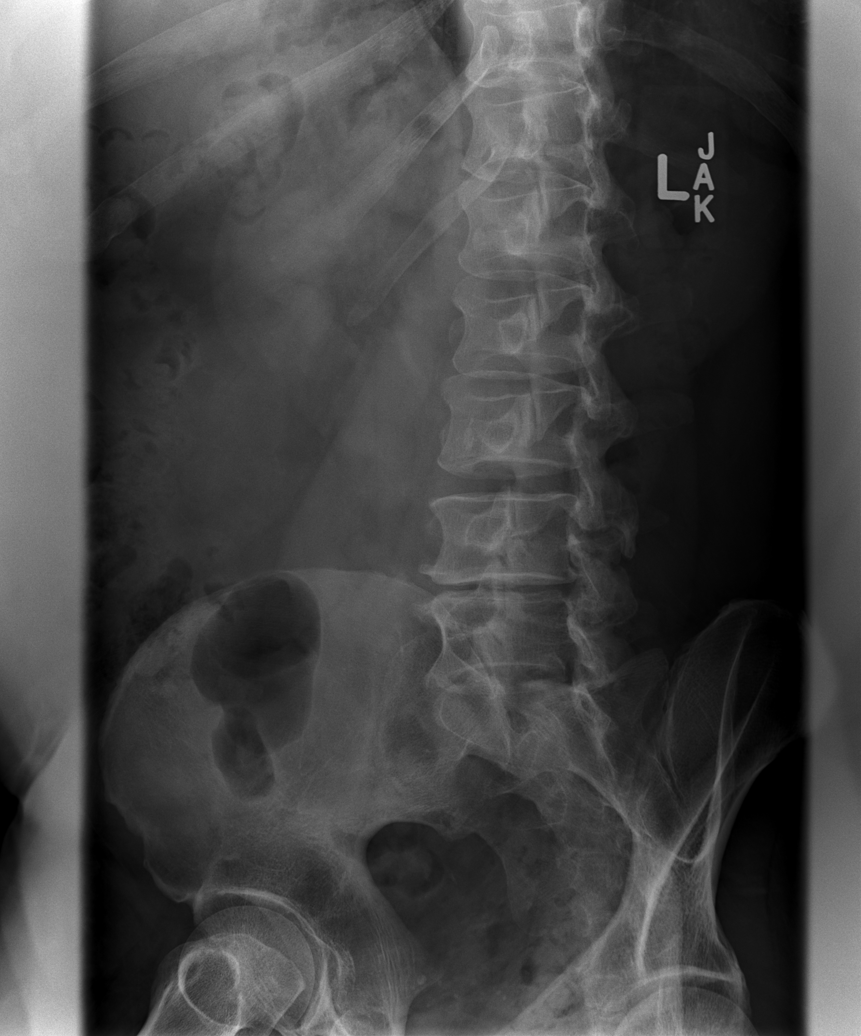

[t lumbar spine obl (2 of 2)]
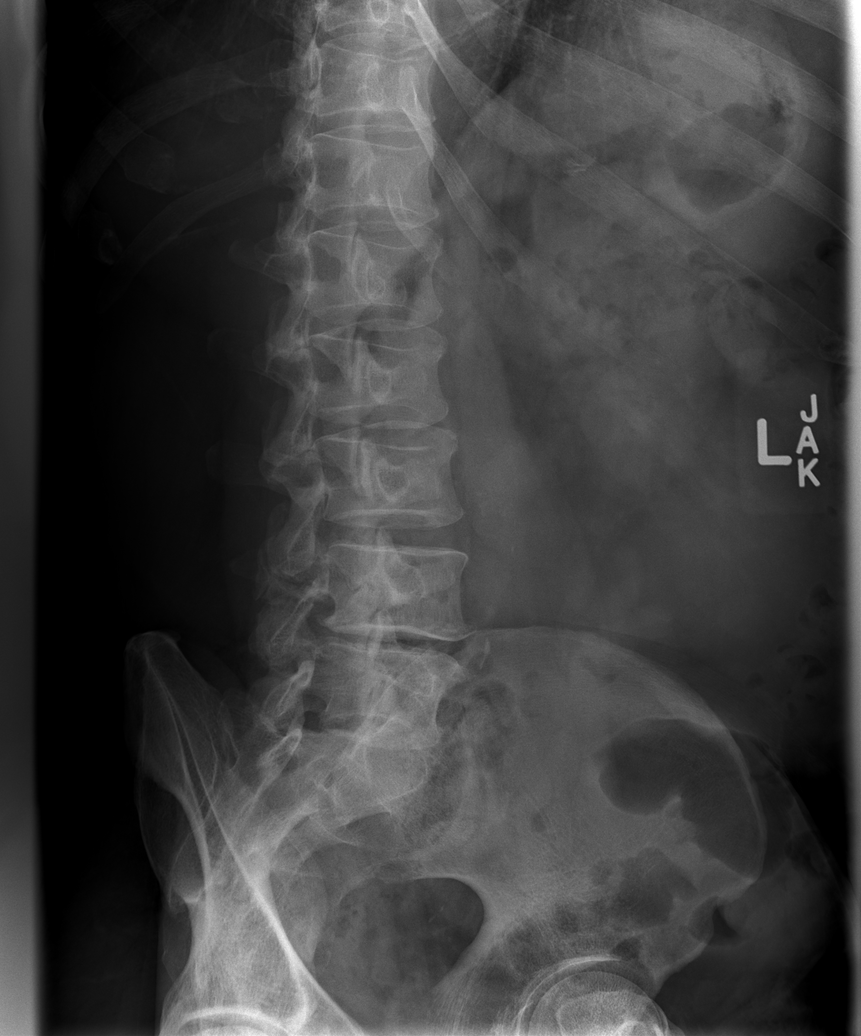

[t lumbar spine lat]
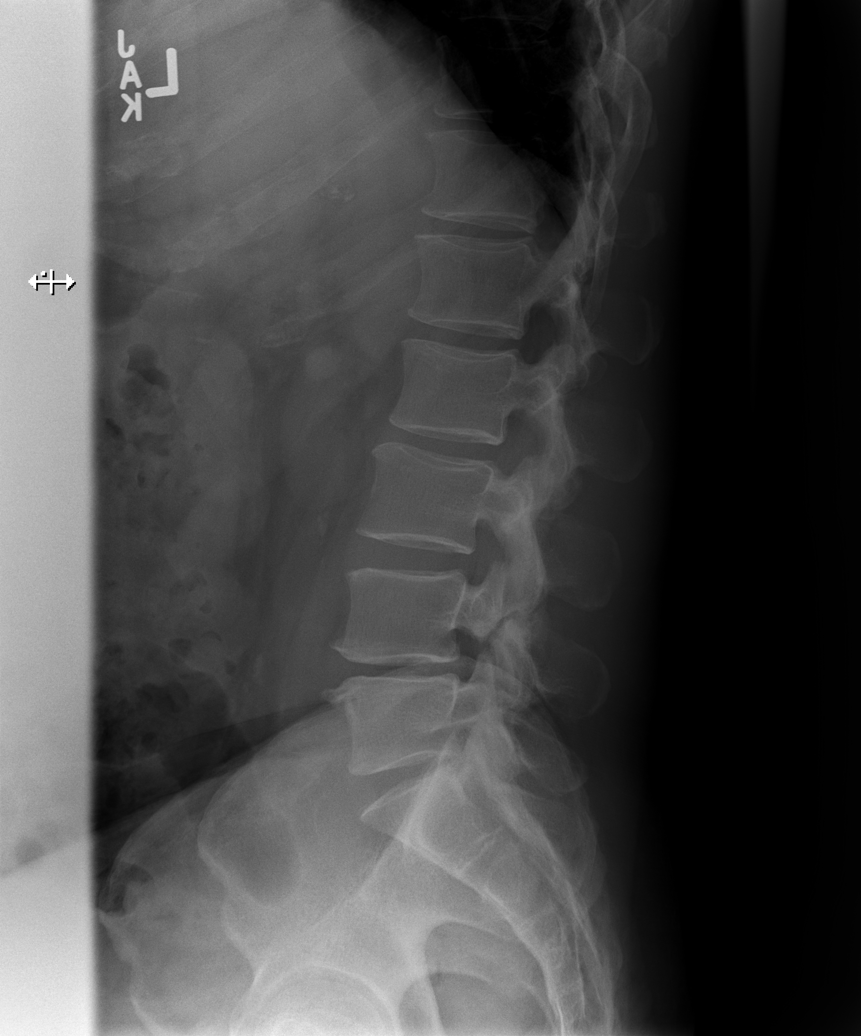

[t lumbar l-5 s-1 spot]
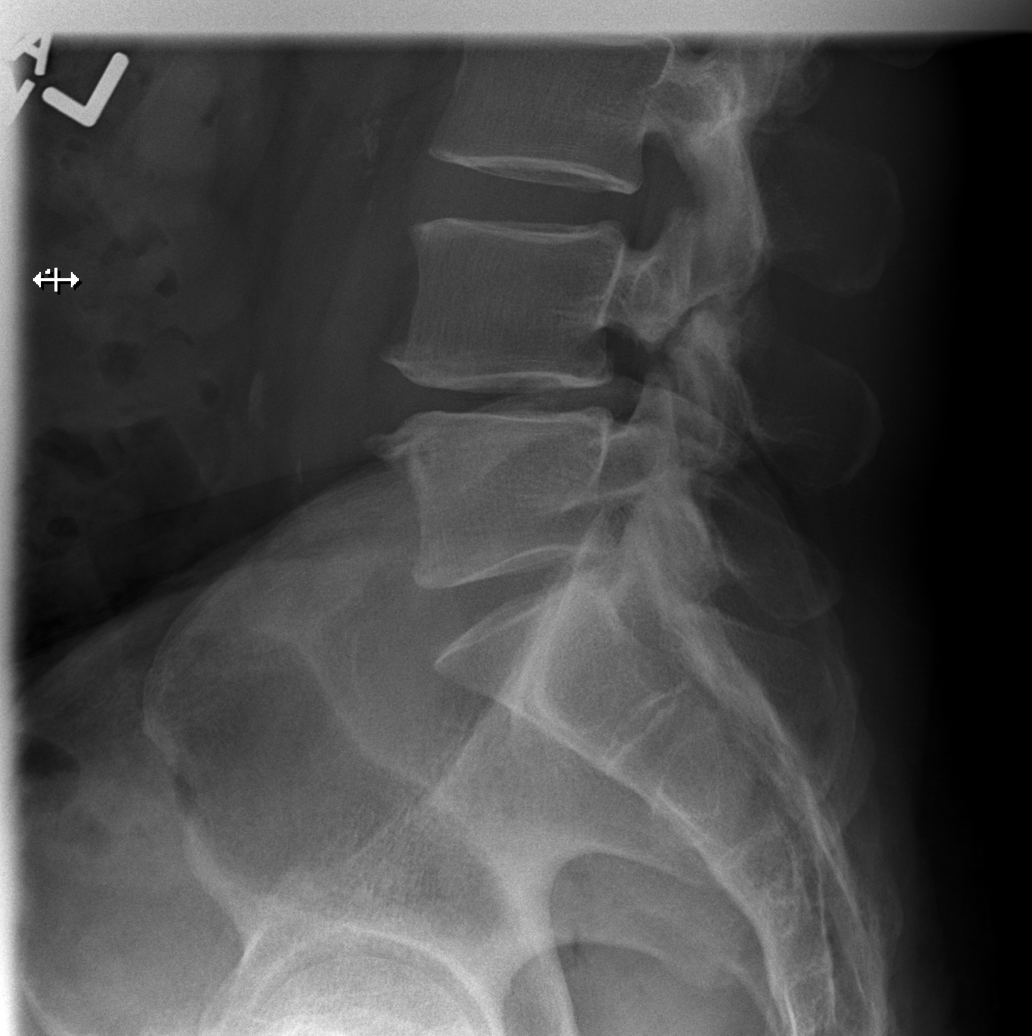

[5 of 5 positions shown; findings below may reference images not displayed]

FINDINGS: Five lumbar type vertebral bodies. No acute fracture or subluxation.
Vertebral body heights are preserved. Chronic bilateral pars defects
at L4. Alignment is normal. Mild-to-moderate disc height loss at
L4-L5. Remaining intervertebral disc spaces are maintained.
Suspected punctate bilateral renal calculi.
IMPRESSION: 1. Mild-to-moderate degenerative disc disease at L4-L5.
2. Chronic bilateral L4 pars defects without spondylolisthesis.
3. Suspected bilateral punctate nephrolithiasis.

## 2020-05-31 IMAGING — CR CERVICAL SPINE - COMPLETE 4+ VIEW
5 series · 5 of 5 positions shown · non-contrast
Comparison: MRI cervical spine dated April 09, 2006.

CLINICAL DATA: Chronic neck pain.

EXAM:
CERVICAL SPINE - COMPLETE 4+ VIEW

[w cervical spine lat]
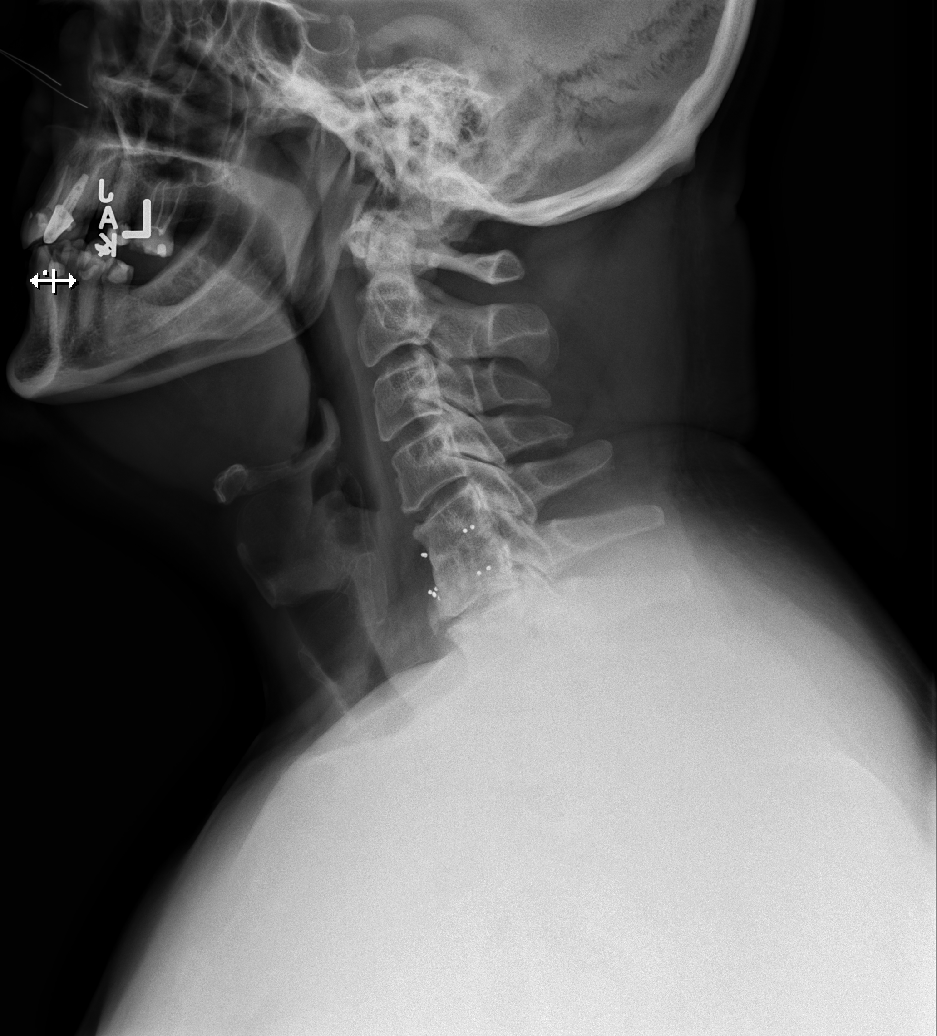

[w cervical spine ap_obl (1 of 2)]
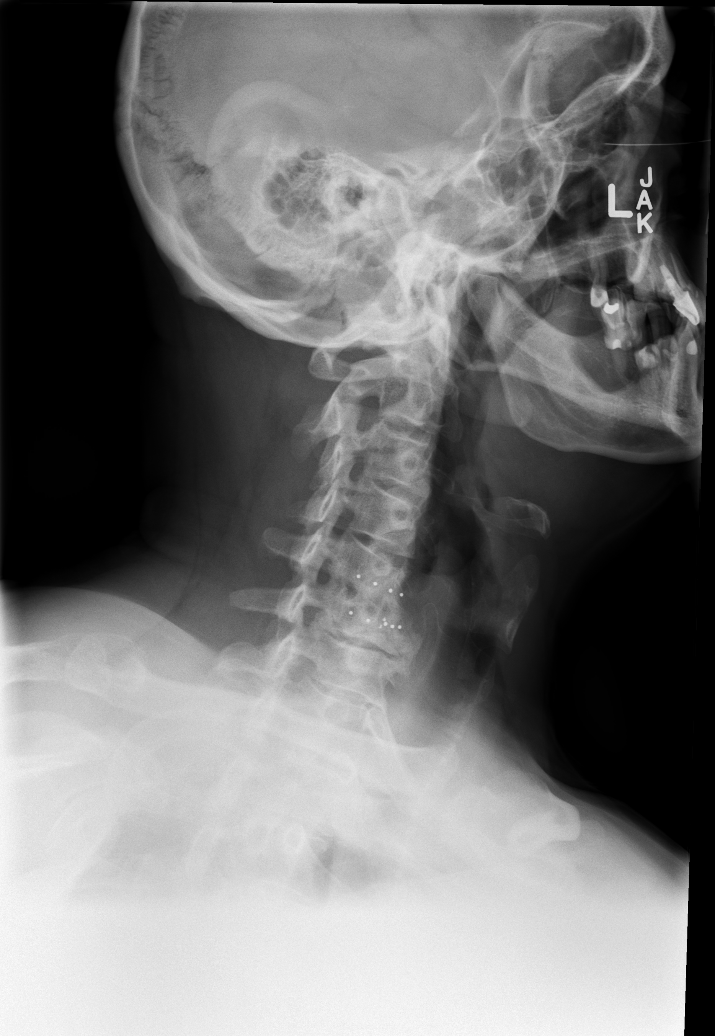

[w cervical spine ap_obl (2 of 2)]
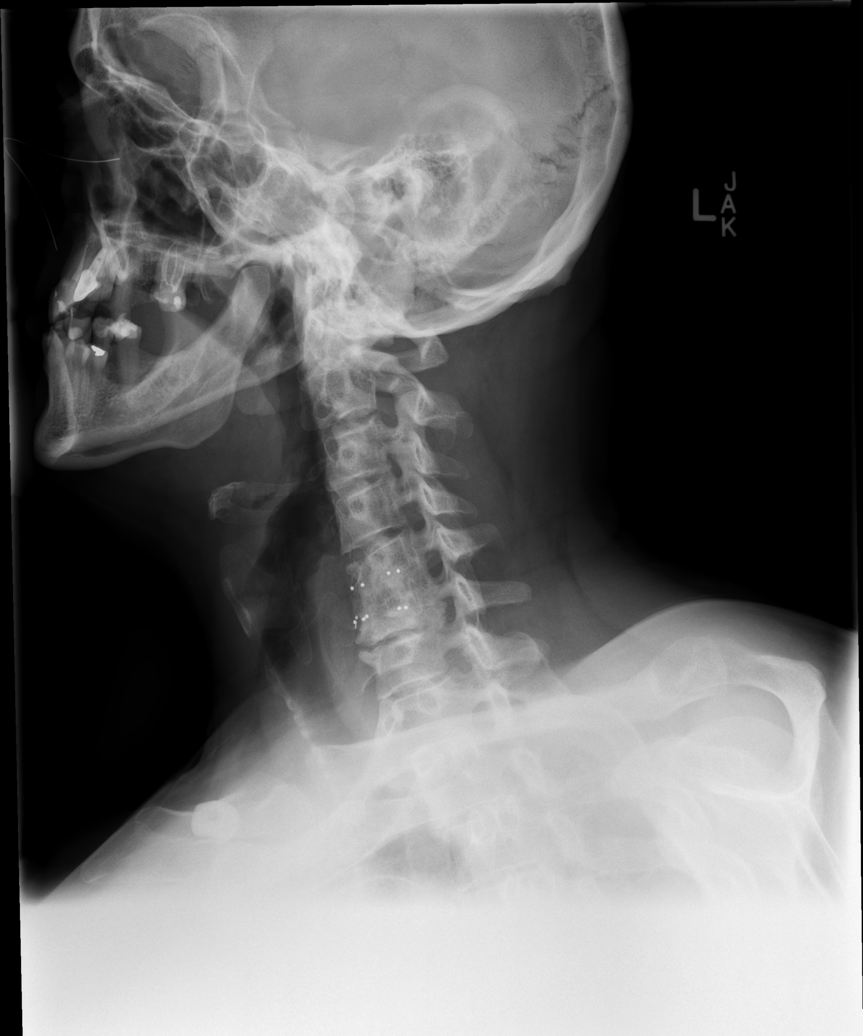

[w cervical spine ap (1 of 2)]
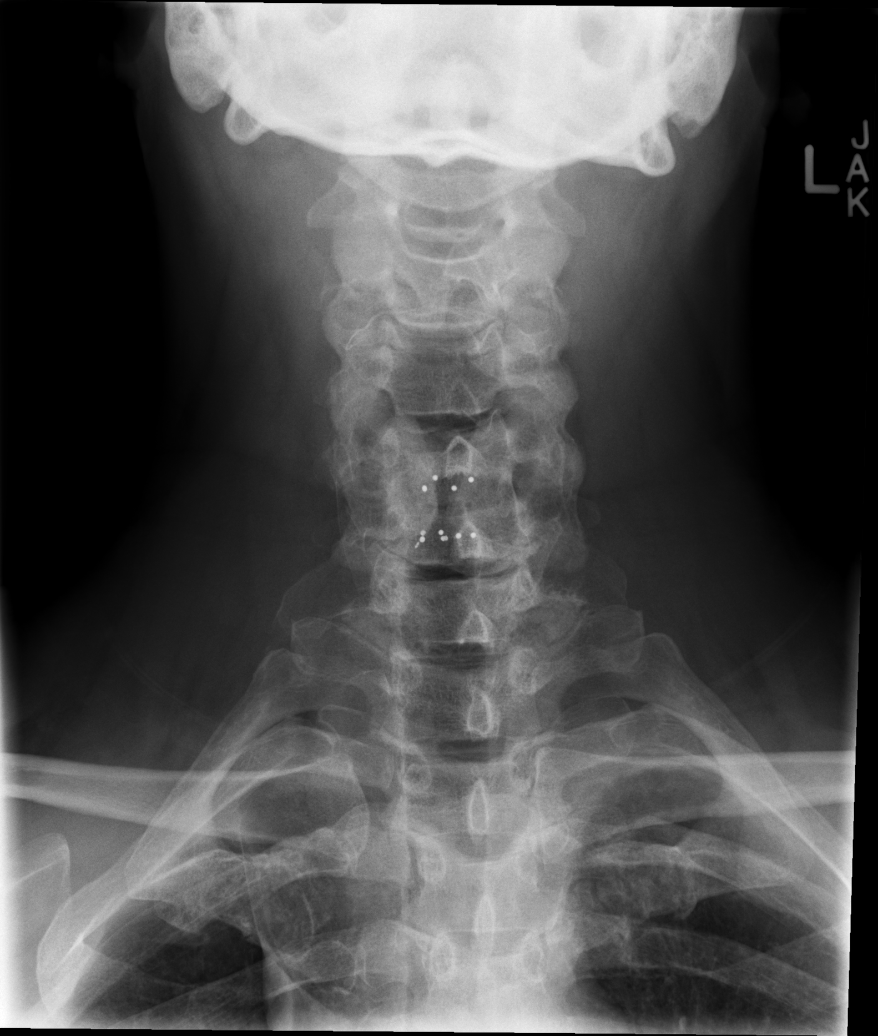

[w cervical spine ap (2 of 2)]
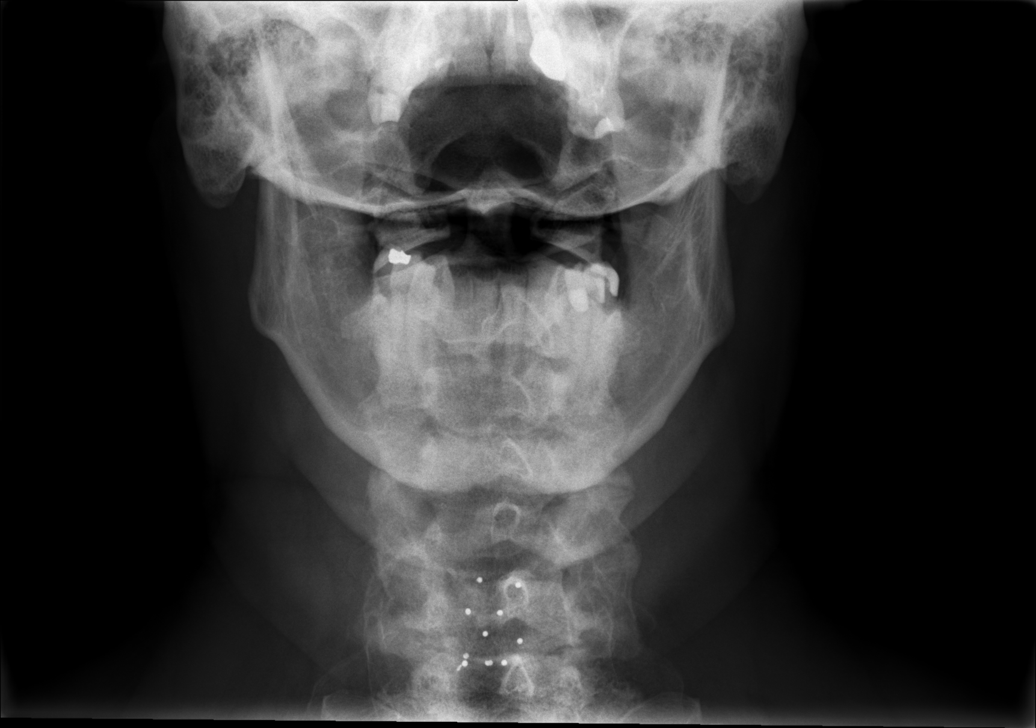

[5 of 5 positions shown; findings below may reference images not displayed]

FINDINGS: The lateral view is diagnostic to the C7 level. Prior C5-C6 ACDF
with solid osseous fusion. There is no acute fracture or
subluxation. Vertebral body heights are preserved. Mild reversal of
the normal cervical lordosis. Sagittal alignment is maintained. Mild
disc height loss at C4-C5. Moderate disc height loss and
uncovertebral hypertrophy at C6-C7. Mild bilateral neuroforaminal
stenosis at C6-C7. Mild right and moderate left neuroforaminal
stenosis at C3-C4.Normal prevertebral soft tissues.
IMPRESSION: 1. Prior C5-C6 ACDF with solid osseous fusion.
2. Multilevel cervical spondylosis as described above, moderate at
C6-C7.
